# Patient Record
Sex: Female | Born: 1970 | Race: White | Hispanic: No | Smoking: Never smoker
Health system: Southern US, Community
[De-identification: ages and names within clinical notes are randomized; demographics above are authoritative.]

## PROBLEM LIST (undated history)

## (undated) DIAGNOSIS — R51 Headache: Secondary | ICD-10-CM

## (undated) DIAGNOSIS — F329 Major depressive disorder, single episode, unspecified: Secondary | ICD-10-CM

## (undated) DIAGNOSIS — F32A Depression, unspecified: Secondary | ICD-10-CM

## (undated) DIAGNOSIS — R200 Anesthesia of skin: Secondary | ICD-10-CM

## (undated) HISTORY — DX: Depression, unspecified: F32.A

## (undated) HISTORY — DX: Headache: R51

## (undated) HISTORY — PX: THYROGLOSSAL DUCT CYST: SHX297

## (undated) HISTORY — DX: Major depressive disorder, single episode, unspecified: F32.9

## (undated) HISTORY — DX: Anesthesia of skin: R20.0

---

## 2000-11-22 ENCOUNTER — Ambulatory Visit (HOSPITAL_COMMUNITY): Admission: RE | Admit: 2000-11-22 | Discharge: 2000-11-22 | Payer: Self-pay | Admitting: Obstetrics

## 2000-11-22 ENCOUNTER — Encounter: Payer: Self-pay | Admitting: Obstetrics

## 2001-01-07 ENCOUNTER — Inpatient Hospital Stay (HOSPITAL_COMMUNITY): Admission: AD | Admit: 2001-01-07 | Discharge: 2001-01-07 | Payer: Self-pay | Admitting: Obstetrics

## 2001-01-23 ENCOUNTER — Encounter (INDEPENDENT_AMBULATORY_CARE_PROVIDER_SITE_OTHER): Payer: Self-pay

## 2001-01-23 ENCOUNTER — Inpatient Hospital Stay (HOSPITAL_COMMUNITY): Admission: AD | Admit: 2001-01-23 | Discharge: 2001-01-27 | Payer: Self-pay | Admitting: Obstetrics

## 2004-09-15 ENCOUNTER — Encounter: Admission: RE | Admit: 2004-09-15 | Discharge: 2004-09-15 | Payer: Self-pay | Admitting: Internal Medicine

## 2004-10-20 ENCOUNTER — Encounter: Admission: RE | Admit: 2004-10-20 | Discharge: 2004-10-20 | Payer: Self-pay | Admitting: Surgery

## 2004-10-24 ENCOUNTER — Encounter (HOSPITAL_COMMUNITY): Admission: RE | Admit: 2004-10-24 | Discharge: 2005-01-22 | Payer: Self-pay | Admitting: Surgery

## 2004-11-13 ENCOUNTER — Encounter (INDEPENDENT_AMBULATORY_CARE_PROVIDER_SITE_OTHER): Payer: Self-pay | Admitting: *Deleted

## 2004-11-13 ENCOUNTER — Ambulatory Visit (HOSPITAL_COMMUNITY): Admission: RE | Admit: 2004-11-13 | Discharge: 2004-11-14 | Payer: Self-pay | Admitting: Surgery

## 2005-02-26 ENCOUNTER — Emergency Department (HOSPITAL_COMMUNITY): Admission: EM | Admit: 2005-02-26 | Discharge: 2005-02-26 | Payer: Self-pay | Admitting: Emergency Medicine

## 2005-02-28 ENCOUNTER — Emergency Department (HOSPITAL_COMMUNITY): Admission: EM | Admit: 2005-02-28 | Discharge: 2005-02-28 | Payer: Self-pay | Admitting: *Deleted

## 2006-02-05 ENCOUNTER — Ambulatory Visit (HOSPITAL_COMMUNITY): Admission: RE | Admit: 2006-02-05 | Discharge: 2006-02-05 | Payer: Self-pay | Admitting: Obstetrics

## 2006-02-21 ENCOUNTER — Encounter: Admission: RE | Admit: 2006-02-21 | Discharge: 2006-02-21 | Payer: Self-pay | Admitting: Obstetrics

## 2006-03-21 ENCOUNTER — Emergency Department (HOSPITAL_COMMUNITY): Admission: EM | Admit: 2006-03-21 | Discharge: 2006-03-21 | Payer: Self-pay | Admitting: Emergency Medicine

## 2006-08-22 ENCOUNTER — Encounter: Admission: RE | Admit: 2006-08-22 | Discharge: 2006-08-22 | Payer: Self-pay | Admitting: Obstetrics

## 2007-08-14 IMAGING — US US MISC SOFT TISSUE
1 series · 13 of 13 positions shown · non-contrast
Comparison: none

CLINICAL DATA: Nodule on clinical exam.
 ULTRASOUND SOFT TISSUE SUPERIOR TO THE THYROID:
 Superior to the thyroid gland, there is a complex cystic structure present.  This complex cystic area measures 2.4 x 1.3 x 2.0 cm.  This does appear to be within the midline and could represent thyroglossal duct cyst.  No adenopathy is seen.  The portion of the thyroid gland that is visible appears normal.

[Series 1: unknown · 0.09mm/px · 13 of 13 slices shown]
[im 1/13]
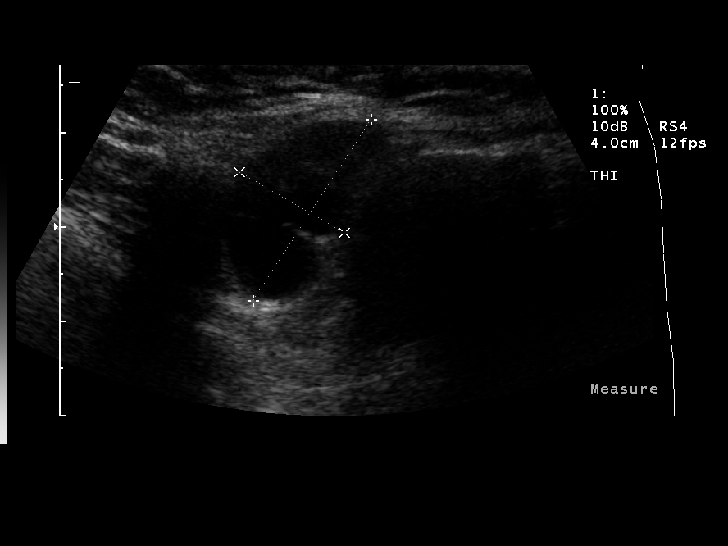
[im 2/13]
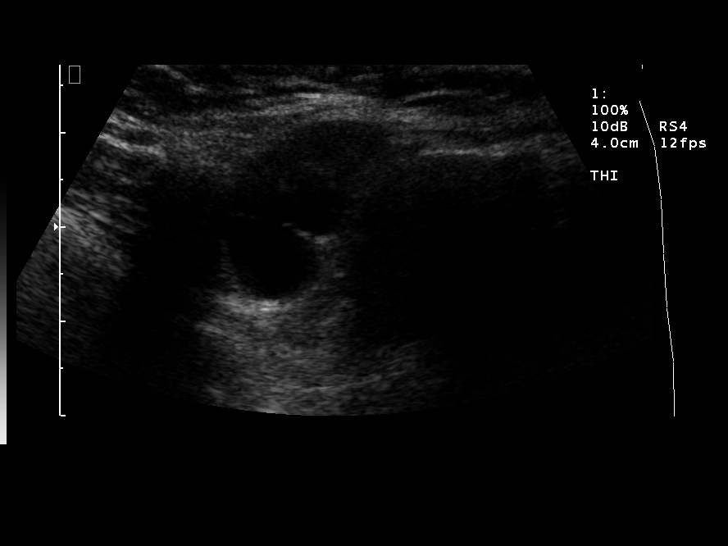
[im 3/13]
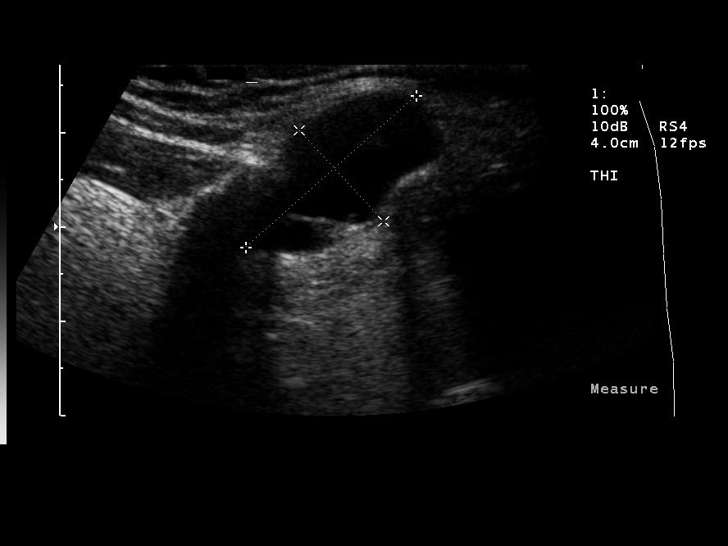
[im 4/13]
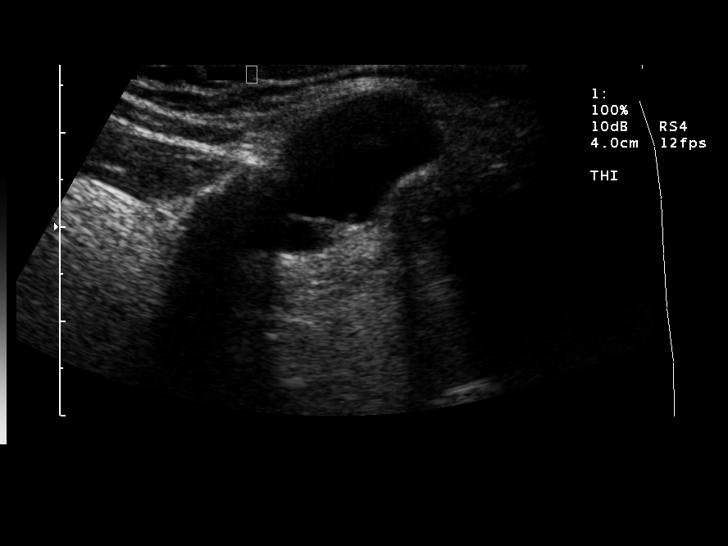
[im 5/13]
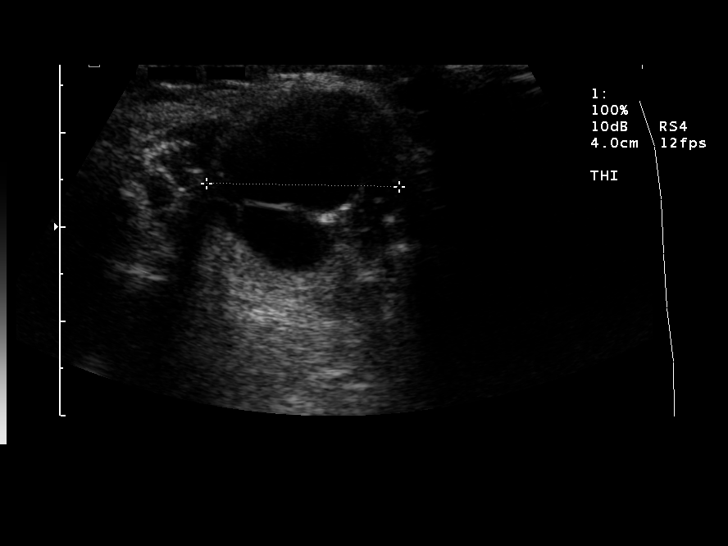
[im 6/13]
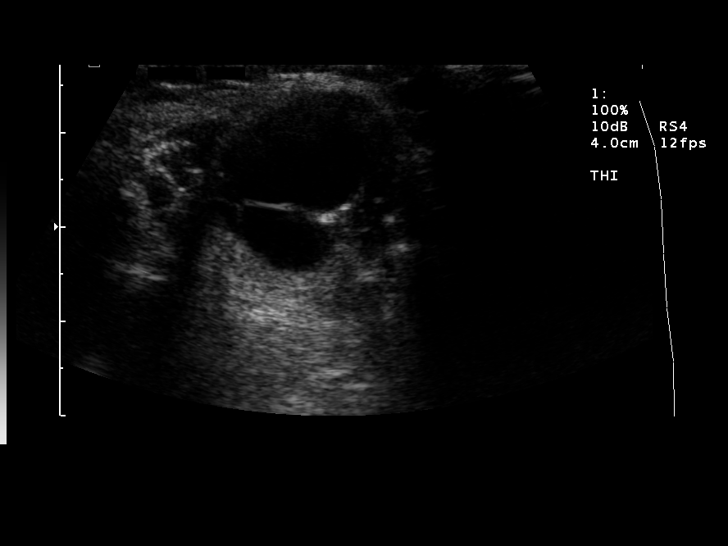
[im 7/13]
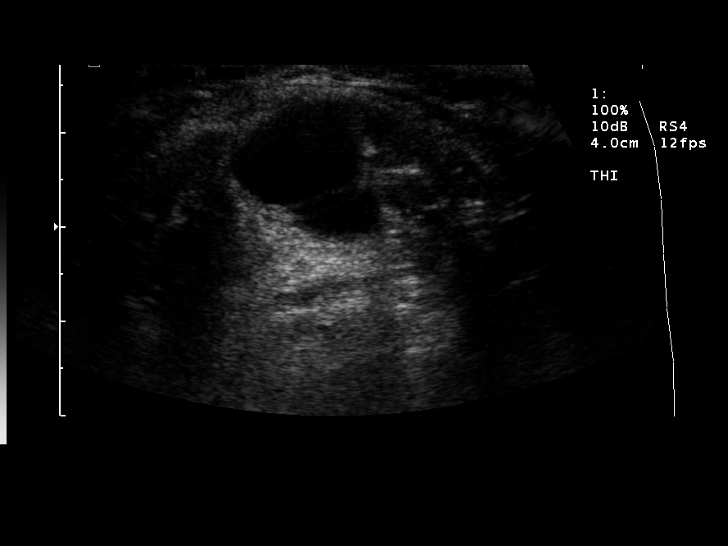
[im 8/13]
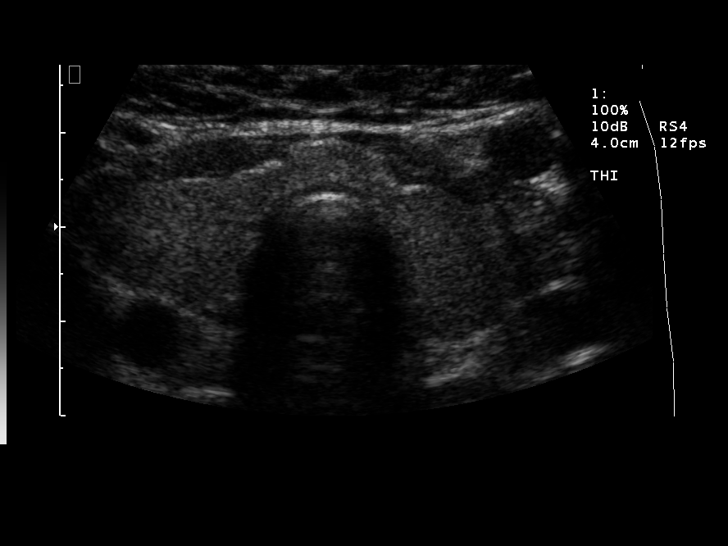
[im 9/13]
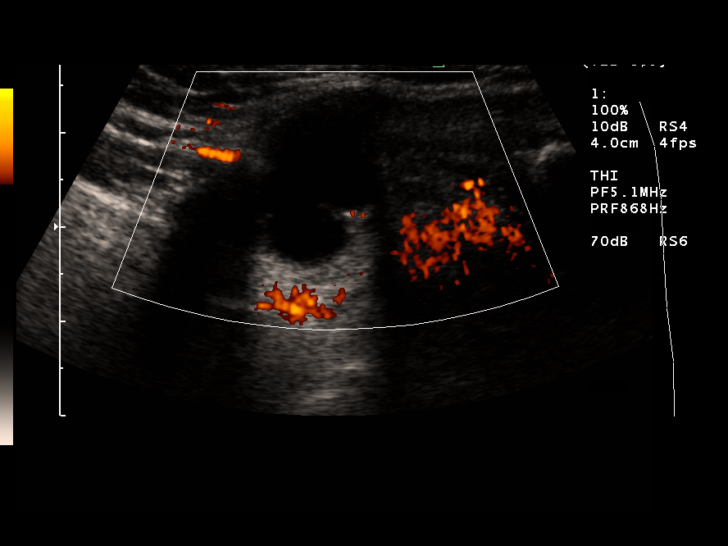
[im 10/13]
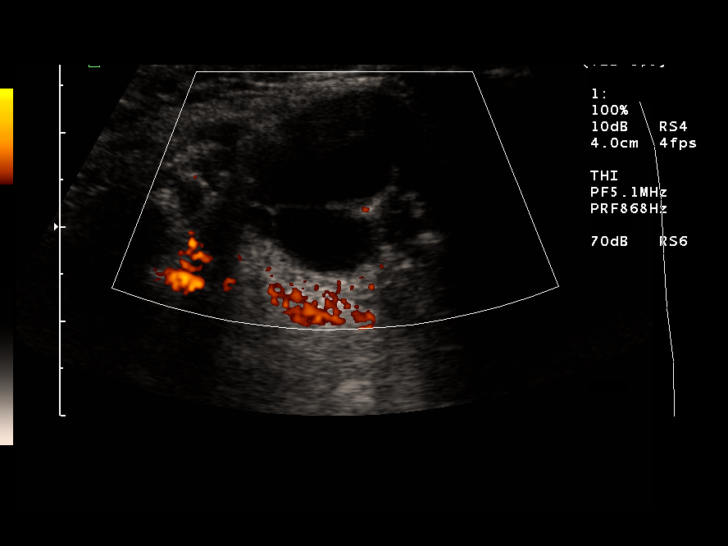
[im 11/13]
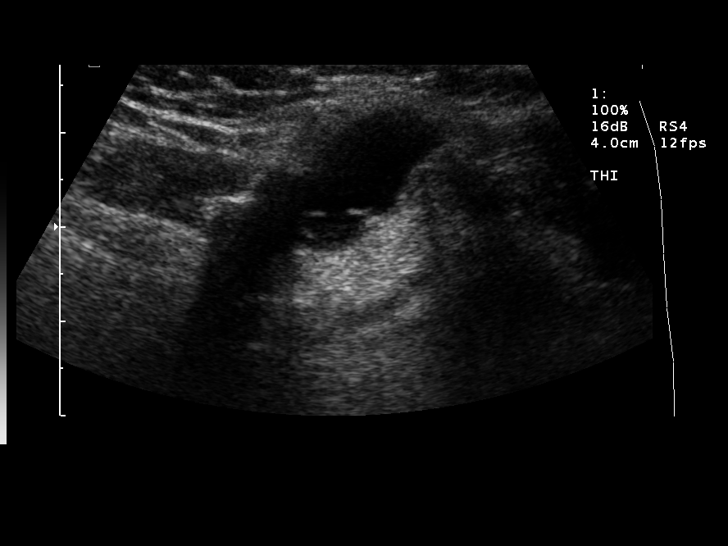
[im 12/13]
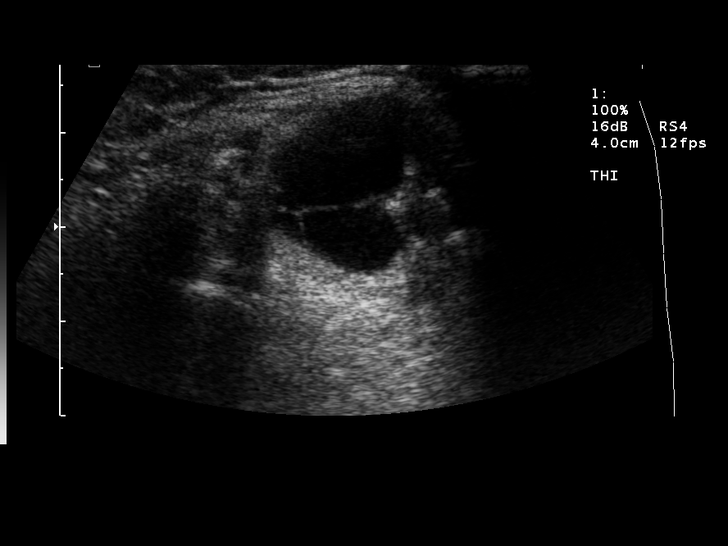
[im 13/13]
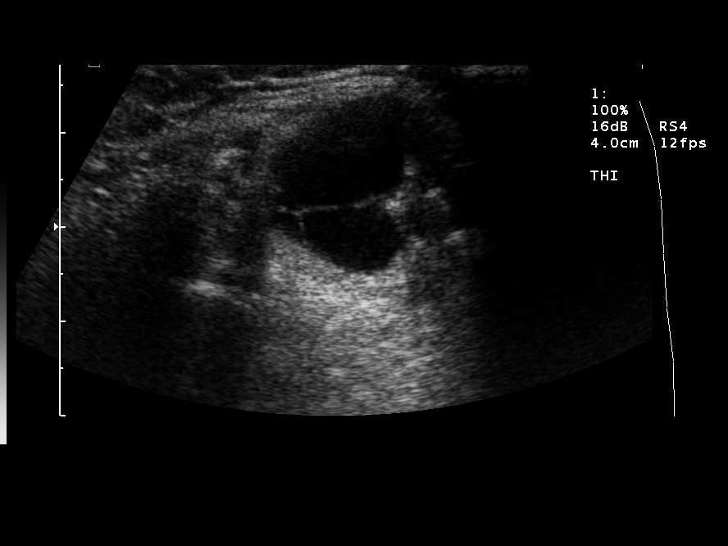

[13 of 13 positions shown; findings below may reference images not displayed]

IMPRESSION: Complex cystic structure in the midline above the thyroid.  Question thyroglossal duct cyst.

## 2007-11-17 ENCOUNTER — Ambulatory Visit: Payer: Self-pay | Admitting: Internal Medicine

## 2007-11-17 DIAGNOSIS — L988 Other specified disorders of the skin and subcutaneous tissue: Secondary | ICD-10-CM | POA: Insufficient documentation

## 2007-11-17 DIAGNOSIS — R519 Headache, unspecified: Secondary | ICD-10-CM | POA: Insufficient documentation

## 2007-11-17 DIAGNOSIS — R51 Headache: Secondary | ICD-10-CM

## 2007-11-17 DIAGNOSIS — F329 Major depressive disorder, single episode, unspecified: Secondary | ICD-10-CM

## 2007-11-17 DIAGNOSIS — E669 Obesity, unspecified: Secondary | ICD-10-CM | POA: Insufficient documentation

## 2008-01-14 ENCOUNTER — Ambulatory Visit: Payer: Self-pay | Admitting: Internal Medicine

## 2008-01-14 DIAGNOSIS — J020 Streptococcal pharyngitis: Secondary | ICD-10-CM | POA: Insufficient documentation

## 2008-01-14 LAB — CONVERTED CEMR LAB: Rapid Strep: POSITIVE

## 2008-05-20 ENCOUNTER — Ambulatory Visit: Payer: Self-pay | Admitting: Family Medicine

## 2008-05-20 DIAGNOSIS — R030 Elevated blood-pressure reading, without diagnosis of hypertension: Secondary | ICD-10-CM

## 2008-05-20 DIAGNOSIS — R1011 Right upper quadrant pain: Secondary | ICD-10-CM | POA: Insufficient documentation

## 2008-05-21 ENCOUNTER — Encounter: Admission: RE | Admit: 2008-05-21 | Discharge: 2008-05-21 | Payer: Self-pay | Admitting: Family Medicine

## 2008-05-21 LAB — CONVERTED CEMR LAB
AST: 21 units/L (ref 0–37)
Albumin: 3.6 g/dL (ref 3.5–5.2)
Alkaline Phosphatase: 95 units/L (ref 39–117)
Basophils Absolute: 0 10*3/uL (ref 0.0–0.1)
Creatinine, Ser: 0.8 mg/dL (ref 0.4–1.2)
Eosinophils Absolute: 0.1 10*3/uL (ref 0.0–0.7)
Glucose, Bld: 77 mg/dL (ref 70–99)
HCT: 36.7 % (ref 36.0–46.0)
Hemoglobin: 12.9 g/dL (ref 12.0–15.0)
Lymphs Abs: 2.5 10*3/uL (ref 0.7–4.0)
MCV: 86.7 fL (ref 78.0–100.0)
Monocytes Absolute: 0.8 10*3/uL (ref 0.1–1.0)
Platelets: 347 10*3/uL (ref 150.0–400.0)
RBC: 4.23 M/uL (ref 3.87–5.11)
Total Bilirubin: 0.4 mg/dL (ref 0.3–1.2)

## 2008-05-26 ENCOUNTER — Encounter: Payer: Self-pay | Admitting: Family Medicine

## 2009-01-31 ENCOUNTER — Ambulatory Visit: Payer: Self-pay | Admitting: Internal Medicine

## 2009-01-31 DIAGNOSIS — J069 Acute upper respiratory infection, unspecified: Secondary | ICD-10-CM | POA: Insufficient documentation

## 2010-01-31 NOTE — Assessment & Plan Note (Signed)
Summary: fever/vomitting/diarrhea/cough/per Judy/cjr   Vital Signs:  Patient profile:   40 year old female Weight:      284 pounds BMI:     44.64 Temp:     98.6 degrees F oral BP sitting:   124 / 102  (left arm) Cuff size:   large  Vitals Entered By: Raechel Ache, RN (January 31, 2009 2:54 PM) CC: Sick since Friday night- Fever 102, sore throat, cough, vomiting, diarrhea and now congestion.   CC:  Sick since Friday night- Fever 102, sore throat, cough, vomiting, and diarrhea and now congestion.Marland Kitchen  History of Present Illness: 40 year old patient who presents with a 3-day history of fever, cough episodic, vomiting, and diarrhea.  Her GI symptoms have resolved.  Her main complaints are  largely nonproductive cough, congestion, mild sore throat.  Her fever also has normalized.  Blood pressure on arrival elevated  Allergies: No Known Drug Allergies  Past History:  Past Medical History: Reviewed history from 11/17/2007 and no changes required. Depression Headache  Review of Systems       The patient complains of anorexia, fever, and prolonged cough.  The patient denies weight loss, weight gain, vision loss, decreased hearing, hoarseness, chest pain, syncope, dyspnea on exertion, peripheral edema, headaches, hemoptysis, abdominal pain, melena, hematochezia, severe indigestion/heartburn, hematuria, incontinence, genital sores, muscle weakness, suspicious skin lesions, transient blindness, difficulty walking, depression, unusual weight change, abnormal bleeding, enlarged lymph nodes, angioedema, and breast masses.    Physical Exam  General:  overweight-appearing.  repeat blood pressure normal at 120/80 Head:  Normocephalic and atraumatic without obvious abnormalities. No apparent alopecia or balding. Eyes:  No corneal or conjunctival inflammation noted. EOMI. Perrla. Funduscopic exam benign, without hemorrhages, exudates or papilledema. Vision grossly normal. Ears:  External ear exam  shows no significant lesions or deformities.  Otoscopic examination reveals clear canals, tympanic membranes are intact bilaterally without bulging, retraction, inflammation or discharge. Hearing is grossly normal bilaterally. Mouth:  pharyngeal erythema.  tonsillar enlargementpharyngeal erythema.   Neck:  No deformities, masses, or tenderness noted; no adenopathy Chest Wall:  No deformities, masses, or tenderness noted. Lungs:  Normal respiratory effort, chest expands symmetrically. Lungs are clear to auscultation, no crackles or wheezes.  normal O2 sat Heart:  Normal rate and regular rhythm. S1 and S2 normal without gallop, murmur, click, rub or other extra sounds.   Impression & Recommendations:  Problem # 1:  URI (ICD-465.9)  Her updated medication list for this problem includes:    Hydrocodone-homatropine 5-1.5 Mg/12ml Syrp (Hydrocodone-homatropine) .Marland Kitchen... 1 teaspoon every 6 hours as needed for cough  Her updated medication list for this problem includes:    Hydrocodone-homatropine 5-1.5 Mg/42ml Syrp (Hydrocodone-homatropine) .Marland Kitchen... 1 teaspoon every 6 hours as needed for cough  Problem # 2:  ELEVATED BLOOD PRESSURE (ICD-796.2)  Complete Medication List: 1)  Hydrocodone-homatropine 5-1.5 Mg/60ml Syrp (Hydrocodone-homatropine) .Marland Kitchen.. 1 teaspoon every 6 hours as needed for cough  Patient Instructions: 1)  Get plenty of rest, drink lots of clear liquids, and use Tylenol or Ibuprofen for fever and comfort. Return in 7-10 days if you're not better:sooner if you're feeling worse. Prescriptions: HYDROCODONE-HOMATROPINE 5-1.5 MG/5ML SYRP (HYDROCODONE-HOMATROPINE) 1 teaspoon every 6 hours as needed for cough  #6oz x 2   Entered and Authorized by:   Gordy Savers  MD   Signed by:   Gordy Savers  MD on 01/31/2009   Method used:   Print then Give to Patient   RxID:   [id number]

## 2010-05-19 NOTE — Discharge Summary (Signed)
[facility]  Patient:    Rita Hill, Rita Hill Visit Number: [id number] MRN: [id number]          Service Type: OBS Location: 910A 9101 01 Attending Physician:  Venita Sheffield Dictated by:   Kathreen Cosier, M.D. Admit Date:  01/23/2001 Discharge Date: 01/27/2001                             Discharge Summary  HISTORY OF PRESENT ILLNESS:   The patient is a 40 year old, primigravida with an EDC of January 24, 2001.  Positive GBS treated with ampicillin at 36 weeks. She was admitted because of a blood pressure of 150/90-150/100 and 3+ proteinuria.  The cervix was fingertip and vertex -2.  HOSPITAL COURSE:              She received Cytotec x 2, magnesium sulfate, and Pitocin stimulation and underwent primary low transverse cesarean section because of failure to progress in labor.  She had a 7 pound 12 ounce female with Apgars of 7 and 9 from the OP position.  On admission, her white count was 8.7.  It went to 11.9.  Platelets ranged between 364 and 311.  Her uric acid ranged between 5.7 and 5.9.  She did well postoperatively.  DISPOSITION:                  She was discharged on the third postoperative day, ambulatory and on a regular diet.  DISCHARGE MEDICATIONS:        Tylox one p.o. q.4h.  FOLLOW-UP:                    To see me in six weeks.  DISCHARGE DIAGNOSIS:          Status post primary low transverse cesarean section at term because of failure to progress. Dictated by:   Kathreen Cosier, M.D. Attending Physician:  Venita Sheffield DD:  01/29/01 TD:  01/29/01 Job: [id number]

## 2010-05-19 NOTE — Op Note (Signed)
[facility]  Patient:    Rita Hill, Rita Hill Visit Number: [id number] MRN: [id number]          Service Type: OBS Location: 910A 9101 01 Attending Physician:  Venita Sheffield Dictated by:   Kathreen Cosier, M.D. Proc. Date: 01/24/01 Admit Date:  01/23/2001 Discharge Date: 01/27/2001                             Operative Report  PREOPERATIVE DIAGNOSIS:       Failure to progress in labor.  ANESTHESIA:                   Epidural.  DESCRIPTION OF PROCEDURE:     The patient was placed on the operating table in the supine position after being prepped and draped and bladder emptied with Foley catheter.  A transverse suprapubic incision was made and carried down through the rectus fascia.  The fascia was ______ the length of the incision, and the recti muscles were retracted laterally.  The peritoneum was incised longitudinally. A transverse incision was made in the viscera in the peritoneum above the bladder, and the bladder was immobilized inferiorly. A transverse lower uterine incision was made.  The patient was delivered from the OP position.  It was a female with Apgars of 7 and 9, weighing 7 lb, 12 oz. The placenta was posterior, removed, and sent to pathology. The uterine cavity was cleaned with dry laps.  The uterine incision was closed in one layer with continuous suture of #1 chromic.  The bladder flap was reattached with 2-0 chromic.  The uterus was well-contracted.  The tubes and ovaries were normal.  The abdomen was closed in layers, the peritoneum with continuous suture of 0 chromic, the fascia with continuous suture of 0 Dexon, and the skin closed with subcuticular stitch of 3-0 plain. Blood loss 600 cc.  The patient tolerated the procedure well and was taken to the recovery room in good condition. Dictated by:   Kathreen Cosier, M.D. Attending Physician:  Venita Sheffield DD:  02/13/01 TD:  02/13/01 Job:  [id number]

## 2010-05-19 NOTE — Op Note (Signed)
Rita Hill, Rita Hill              ACCOUNT NO.:  [id number]   MEDICAL RECORD NO.:  [id number]          PATIENT TYPE:  AMB   LOCATION:  DAY                          FACILITY:  [facility]   PHYSICIAN:  Currie Paris, M.D.DATE OF BIRTH:  [birth year]   DATE OF PROCEDURE:  11/13/2004  DATE OF DISCHARGE:                                 OPERATIVE REPORT   CCS NUMBER:  [id number].   PREOPERATIVE DIAGNOSIS:  Thyroglossal duct cyst.   POSTOPERATIVE DIAGNOSIS:  Thyroglossal duct cyst.   OPERATION:  Excision of thyroglossal duct cyst.   SURGEON:  Currie Paris, M.D.   ANESTHESIA:  General.   CLINICAL HISTORY:  This is a 40 year old lady who has presented with a small  mass in the anterior cervical area just superior to the thyroid and appeared  to be a complex cyst.  After discussion with the patient, she elected to  have this mass excised with a clinical diagnosis of thyroglossal duct cyst.   DESCRIPTION OF PROCEDURE:  The patient was in the holding area, and she had  no further questions.  The mass was palpable and marked.  It was just very  subtle in this patient, who has a fairly large neck.  She was taken to the  operating room.  After satisfactory general endotracheal anesthesia  obtained, the neck was prepped and draped.  A time out occurred.   I made a transverse incision directly over the area we had marked in the  holding area.  Subcutaneous tissues were divided, and subcutaneous flaps  were raised superiorly and inferiorly.  Initially, I did not see the mass,  but I did find it in the midline just above the larynx.  It was fairly well  circumscribed.   Using careful dissection, I was able to free it up with a combination of  blunt and sharp dissection from the underlying and surrounding tissues  inferiorly and to the right and left and then underneath it and deep to it.  It tracked superiorly and towards the hyoid.  Clearly, I thought it  represented a thyroglossal duct  cyst.  Once I had it freed up, except for  around the hyoid bone, I freed the hyoid by using the cautery to detach  muscular attachments from the central portion of it, and going about 0.5 cm  on either side of where I thought the cyst was going into and attaching and  probably going through the hyoid bone, central portion.  Once this was  completely freed up, I was able to divide the hyoid.  There was a small  strand of tissue going directly deep from here, and I traced it to what I  thought was the base of the tongue, consistent with the residual cyst/sinus.  This was clamped at its base and suture-ligated with a 3-0 Vicryl.  The  specimen washanded off and sent to pathhology.   I made sure everything was dry.  I put some Marcaine in it to help with  postoperative analgesia.  I left a little Surgicel around the area where the  hyoid had been divided,  but it did appear to be dry.   The incision was then closed with some 3-0 Vicryl, 4-0 Monocryl  subcuticular, and Dermabond.  The patient tolerated the procedure well.  There were no complications.  All counts were correct.      Currie Paris, M.D.  Electronically Signed     CJS/MEDQ  D:  11/13/2004  T:  11/13/2004  Job:  [id number]

## 2010-08-10 ENCOUNTER — Other Ambulatory Visit (HOSPITAL_COMMUNITY): Payer: Self-pay | Admitting: Obstetrics

## 2010-08-10 ENCOUNTER — Other Ambulatory Visit: Payer: Self-pay | Admitting: Obstetrics

## 2010-08-10 DIAGNOSIS — E282 Polycystic ovarian syndrome: Secondary | ICD-10-CM

## 2010-08-10 DIAGNOSIS — Z1231 Encounter for screening mammogram for malignant neoplasm of breast: Secondary | ICD-10-CM

## 2010-08-17 ENCOUNTER — Ambulatory Visit
Admission: RE | Admit: 2010-08-17 | Discharge: 2010-08-17 | Disposition: A | Discharge status: Home / Self Care | Payer: 59 | Source: Ambulatory Visit | Attending: Obstetrics | Admitting: Obstetrics

## 2010-08-17 ENCOUNTER — Ambulatory Visit (HOSPITAL_COMMUNITY)
Admission: RE | Admit: 2010-08-17 | Discharge: 2010-08-17 | Disposition: A | Discharge status: Home / Self Care | Payer: 59 | Source: Ambulatory Visit | Attending: Obstetrics | Admitting: Obstetrics

## 2010-08-17 DIAGNOSIS — E282 Polycystic ovarian syndrome: Secondary | ICD-10-CM

## 2010-08-17 DIAGNOSIS — Z1231 Encounter for screening mammogram for malignant neoplasm of breast: Secondary | ICD-10-CM

## 2010-08-17 DIAGNOSIS — N912 Amenorrhea, unspecified: Secondary | ICD-10-CM | POA: Insufficient documentation

## 2010-08-17 DIAGNOSIS — N949 Unspecified condition associated with female genital organs and menstrual cycle: Secondary | ICD-10-CM | POA: Insufficient documentation

## 2011-02-21 ENCOUNTER — Encounter: Payer: Self-pay | Admitting: Family

## 2011-02-21 ENCOUNTER — Ambulatory Visit (INDEPENDENT_AMBULATORY_CARE_PROVIDER_SITE_OTHER): Payer: 59 | Admitting: Family

## 2011-02-21 VITALS — BP 130/90 | Temp 98.0°F | Ht 67.0 in | Wt 305.0 lb

## 2011-02-21 DIAGNOSIS — J069 Acute upper respiratory infection, unspecified: Secondary | ICD-10-CM

## 2011-02-21 DIAGNOSIS — R05 Cough: Secondary | ICD-10-CM

## 2011-02-21 MED ORDER — FEXOFENADINE-PSEUDOEPHED ER 180-240 MG PO TB24: 1.0000 | ORAL_TABLET | Freq: Every day | ORAL | Status: AC

## 2011-02-21 MED ORDER — PREDNISONE 20 MG PO TABS: 40.0000 mg | ORAL_TABLET | Freq: Every day | ORAL | Status: AC

## 2011-02-21 NOTE — Patient Instructions (Signed)

## 2011-02-21 NOTE — Progress Notes (Signed)
  Subjective:    Patient ID: Rita Hill, female    DOB: [birth year], 41 y.o.   MRN: [id number]  Sinusitis This is a new problem. The current episode started yesterday. The problem is unchanged. There has been no fever. Associated symptoms include congestion, coughing, ear pain and sneezing. Past treatments include oral decongestants. The treatment provided mild relief.      Review of Systems  Constitutional: Negative.   HENT: Positive for ear pain, congestion and sneezing.   Eyes: Negative.   Respiratory: Positive for cough.   Cardiovascular: Negative.   Genitourinary: Negative.   Musculoskeletal: Negative.   Skin: Negative.   Neurological: Negative.   Hematological: Negative.    Past Medical History  Diagnosis Date  . Depression   . Headache     History   Social History  . Marital Status: Married    Spouse Name: N/A    Number of Children: N/A  . Years of Education: N/A   Occupational History  . Not on file.   Social History Main Topics  . Smoking status: Never Smoker   . Smokeless tobacco: Not on file  . Alcohol Use:   . Drug Use:   . Sexually Active:    Other Topics Concern  . Not on file   Social History Narrative  . No narrative on file    Past Surgical History  Procedure Date  . Cesarean section   . Thyroglossal duct cyst     removal    Family History  Problem Relation Age of Onset  . Hypertension Mother   . Hyperlipidemia Mother     No Known Allergies  No current outpatient prescriptions on file prior to visit.    BP 130/90  Temp(Src) 98 F (36.7 C) (Oral)  Ht 5\' 7"  (1.702 m)  Wt 305 lb (138.347 kg)  BMI 47.77 kg/m2chart    Objective:   Physical Exam  Constitutional: She appears well-developed and well-nourished.  HENT:  Right Ear: External ear normal.  Nose: Nose normal.  Mouth/Throat: Oropharynx is clear and moist.       Pharynx red  Neck: Normal range of motion. Neck supple.  Cardiovascular: Normal rate, regular rhythm  and normal heart sounds.   Pulmonary/Chest: Effort normal and breath sounds normal.  Musculoskeletal: Normal range of motion.  Neurological: She is alert.  Skin: Skin is warm and dry.  Psychiatric: She has a normal mood and affect.          Assessment & Plan:  Assessment: Upper Respiratory Infection, Cough  Plan: Prednisone 20mg  2 po QD x 5 days. Allegra-d 1 tab po QD. Call the office if symptoms worsen or persist. Recheck as scheduled and prn.

## 2011-09-04 ENCOUNTER — Ambulatory Visit (INDEPENDENT_AMBULATORY_CARE_PROVIDER_SITE_OTHER): Payer: 59 | Admitting: Internal Medicine

## 2011-09-04 ENCOUNTER — Encounter: Payer: Self-pay | Admitting: Internal Medicine

## 2011-09-04 VITALS — BP 140/80 | Temp 98.2°F | Wt 312.0 lb

## 2011-09-04 DIAGNOSIS — E669 Obesity, unspecified: Secondary | ICD-10-CM

## 2011-09-04 DIAGNOSIS — R030 Elevated blood-pressure reading, without diagnosis of hypertension: Secondary | ICD-10-CM

## 2011-09-04 MED ORDER — HYDROCODONE-HOMATROPINE 5-1.5 MG/5ML PO SYRP: 5.0000 mL | ORAL_SOLUTION | Freq: Three times a day (TID) | ORAL | Status: AC | PRN

## 2011-09-04 NOTE — Patient Instructions (Addendum)
Get plenty of rest, Drink lots of  clear liquids, and use Tylenol or ibuprofen for fever and discomfort.    Nasonex-  use daily  Limit your sodium (Salt) intake  Please check your blood pressure on a regular basis.  If it is consistently greater than 150/90, please make an office appointment.  Obtain a home blood pressure monitor and return in 6 weeks to reassess your blood pressure    It is important that you exercise regularly, at least 20 minutes 3 to 4 times per week.  If you develop chest pain or shortness of breath seek  medical attention.  You need to lose weight.  Consider a lower calorie diet and regular exercise.DASH Diet The DASH diet stands for "Dietary Approaches to Stop Hypertension." It is a healthy eating plan that has been shown to reduce high blood pressure (hypertension) in as little as 14 days, while also possibly providing other significant health benefits. These other health benefits include reducing the risk of breast cancer after menopause and reducing the risk of type 2 diabetes, heart disease, colon cancer, and stroke. Health benefits also include weight loss and slowing kidney failure in patients with chronic kidney disease.   DIET GUIDELINES  Limit salt (sodium). Your diet should contain less than 1500 mg of sodium daily.   Limit refined or processed carbohydrates. Your diet should include mostly whole grains. Desserts and added sugars should be used sparingly.   Include small amounts of heart-healthy fats. These types of fats include nuts, oils, and tub margarine. Limit saturated and trans fats. These fats have been shown to be harmful in the body.  CHOOSING FOODS   The following food groups are based on a 2000 calorie diet. See your Registered Dietitian for individual calorie needs. Grains and Grain Products (6 to 8 servings daily)  Eat More Often: Whole-wheat bread, brown rice, whole-grain or wheat pasta, quinoa, popcorn without added fat or salt (air popped).     Eat Less Often: White bread, white pasta, white rice, cornbread.  Vegetables (4 to 5 servings daily)  Eat More Often: Fresh, frozen, and canned vegetables. Vegetables may be raw, steamed, roasted, or grilled with a minimal amount of fat.   Eat Less Often/Avoid: Creamed or fried vegetables. Vegetables in a cheese sauce.  Fruit (4 to 5 servings daily)  Eat More Often: All fresh, canned (in natural juice), or frozen fruits. Dried fruits without added sugar. One hundred percent fruit juice ( cup [237 mL] daily).   Eat Less Often: Dried fruits with added sugar. Canned fruit in light or heavy syrup.  [employer], Fish, and Poultry (2 servings or less daily. One serving is 3 to 4 oz [85-114 g]).  Eat More Often: Ninety percent or leaner ground beef, tenderloin, sirloin. Round cuts of beef, chicken breast, [place] breast. All fish. Grill, bake, or broil your meat. Nothing should be fried.   Eat Less Often/Avoid: Fatty cuts of meat, [place], or chicken leg, thigh, or wing. Fried cuts of meat or fish.  Dairy (2 to 3 servings)  Eat More Often: Low-fat or fat-free milk, low-fat plain or light yogurt, reduced-fat or part-skim cheese.   Eat Less Often/Avoid: Milk (whole, 2%, skim, or chocolate). Whole milk yogurt. Full-fat cheeses.  Nuts, Seeds, and Legumes (4 to 5 servings per week)  Eat More Often: All without added salt.   Eat Less Often/Avoid: Salted nuts and seeds, canned beans with added salt.  Fats and Sweets (limited)  Eat More Often: Vegetable  oils, tub margarines without trans fats, sugar-free gelatin. Mayonnaise and salad dressings.   Eat Less Often/Avoid: Coconut oils, palm oils, butter, stick margarine, cream, half and half, cookies, candy, pie.  FOR MORE INFORMATION The Dash Diet Eating Plan: www.dashdiet.org Document Released: 12/07/2010 Document Reviewed: 11/27/2010 [facility] Patient Information 2012 [place], [place].

## 2011-09-04 NOTE — Progress Notes (Signed)
  Subjective:    Patient ID: Rita Hill, female    DOB: 1970-10-30, 41 y.o.   MRN: [id number]  HPI  41 year old patient who is seen today for followup. For the past 4 weeks she has noticed some fullness involving the left ear only. For one day she has had some head and chest congestion and cough. No fever. 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 She also has a history of some elevated blood pressure readings in the past. A recent dental cleaning was rescheduled it to elevated blood pressure at her dentist.  BP Readings from Last 3 Encounters:  09/04/11 140/80  02/21/11 130/90  01/31/09 124/102      Review of Systems  Constitutional: Positive for fatigue.  HENT: Positive for congestion and rhinorrhea. Negative for hearing loss, sore throat, dental problem, sinus pressure and tinnitus.   Eyes: Negative for pain, discharge and visual disturbance.  Respiratory: Positive for cough and chest tightness. Negative for shortness of breath.   Cardiovascular: Negative for chest pain, palpitations and leg swelling.  Gastrointestinal: Negative for nausea, vomiting, abdominal pain, diarrhea, constipation, blood in stool and abdominal distention.  Genitourinary: Negative for dysuria, urgency, frequency, hematuria, flank pain, vaginal bleeding, vaginal discharge, difficulty urinating, vaginal pain and pelvic pain.  Musculoskeletal: Negative for joint swelling, arthralgias and gait problem.  Skin: Negative for rash.  Neurological: Negative for dizziness, syncope, speech difficulty, weakness, numbness and headaches.  Hematological: Negative for adenopathy.  Psychiatric/Behavioral: Negative for behavioral problems, dysphoric mood and agitation. The patient is not nervous/anxious.        Objective:   Physical Exam  Constitutional: She is oriented to person, place, and time. She appears well-developed and well-nourished.       Blood pressure 140/96  HENT:  Head: Normocephalic.  Right Ear:  External ear normal.  Left Ear: External ear normal.  Mouth/Throat: Oropharynx is clear and moist.  Eyes: Conjunctivae and EOM are normal. Pupils are equal, round, and reactive to light.  Neck: Normal range of motion. Neck supple. No thyromegaly present.  Cardiovascular: Normal rate, regular rhythm, normal heart sounds and intact distal pulses.   Pulmonary/Chest: Effort normal and breath sounds normal.  Abdominal: Soft. Bowel sounds are normal. She exhibits no mass. There is no tenderness.  Musculoskeletal: Normal range of motion.  Lymphadenopathy:    She has no cervical adenopathy.  Neurological: She is alert and oriented to person, place, and time.  Skin: Skin is warm and dry. No rash noted.  Psychiatric: She has a normal mood and affect. Her behavior is normal.          Assessment & Plan:   Viral  URI. We'll treat symptomatically  rule out hypertension. Nonpharmacologic measures discussed. Home blood pressure monitor and discussed. Last return in 6 weeks for followup Exogenous obesity. Diet weight loss encouraged

## 2011-10-16 ENCOUNTER — Ambulatory Visit: Payer: 59 | Admitting: Internal Medicine

## 2012-07-07 ENCOUNTER — Encounter: Payer: Self-pay | Admitting: Internal Medicine

## 2012-07-07 ENCOUNTER — Ambulatory Visit (INDEPENDENT_AMBULATORY_CARE_PROVIDER_SITE_OTHER): Payer: 59 | Admitting: Internal Medicine

## 2012-07-07 VITALS — BP 142/96 | HR 84 | Temp 98.5°F | Resp 20 | Wt 294.0 lb

## 2012-07-07 DIAGNOSIS — R29810 Facial weakness: Secondary | ICD-10-CM

## 2012-07-07 MED ORDER — VALACYCLOVIR HCL 1 G PO TABS: ORAL_TABLET | ORAL | Status: AC

## 2012-07-07 MED ORDER — HYDROCHLOROTHIAZIDE 25 MG PO TABS: 25.0000 mg | ORAL_TABLET | Freq: Every day | ORAL | Status: AC

## 2012-07-07 MED ORDER — PREDNISONE 20 MG PO TABS: ORAL_TABLET | ORAL | Status: AC

## 2012-07-07 NOTE — Progress Notes (Signed)
  Subjective:    Patient ID: Rita Hill, female    DOB: Mar 15, 1970, 42 y.o.   MRN: [id number]  HPI 42 year old patient who was stable until Saturday when she first noticed some numbness involving the left lateral tongue. Yesterday she noted some numbness involving the left lip and left buccal mucosa. She has a chronic mild left ptosis but this has worsened and she has also developed a mild left facial weakness. The past few weeks she has noticed some ringing in the ears that has intensified over the past week for several weeks she also describes some nonspecific difficulties with her balance.  Past Medical History  Diagnosis Date  . Depression   . Headache(784.0)     History   Social History  . Marital Status: Married    Spouse Name: N/A    Number of Children: N/A  . Years of Education: N/A   Occupational History  . Not on file.   Social History Main Topics  . Smoking status: Never Smoker   . Smokeless tobacco: Never Used  . Alcohol Use: No  . Drug Use: No  . Sexually Active: Not on file   Other Topics Concern  . Not on file   Social History Narrative  . No narrative on file    Past Surgical History  Procedure Laterality Date  . Cesarean section    . Thyroglossal duct cyst      removal    Family History  Problem Relation Age of Onset  . Hypertension Mother   . Hyperlipidemia Mother     No Known Allergies  No current outpatient prescriptions on file prior to visit.   No current facility-administered medications on file prior to visit.    BP 142/96  Pulse 84  Temp(Src) 98.5 F (36.9 C) (Oral)  Resp 20  Wt 294 lb (133.358 kg)  BMI 46.04 kg/m2  SpO2 98%      Review of Systems  Neurological: Positive for dizziness, weakness and light-headedness.       Objective:   Physical Exam  Constitutional: She appears well-developed and well-nourished. No distress.  Blood pressure 140/100  HENT:  Right Ear: External ear normal.  Left Ear: External  ear normal.  Musculoskeletal:  Mild left facial weakness with ptosis and decrease eye blink on the left Diminished left nasolabial fold Extractor muscles are full No drift Normal finger-nose testing Romberg negative Able to do a tandem walk but a bit unsteady Decreased sensation left facial area          Assessment & Plan:   Atypical Bells palsy  With sensory findings HTN  Will treat with Prednisone and Valtrex Consider MRI

## 2012-07-07 NOTE — Patient Instructions (Addendum)
Limit your sodium (Salt) intake  You need to lose weight.  Consider a lower calorie diet and regular exercise.  Call or return to clinic prn if these symptoms worsen or fail to improve as anticipated.  Recheck 1 week  Brain MRI as discussed

## 2012-07-14 ENCOUNTER — Encounter: Payer: Self-pay | Admitting: Internal Medicine

## 2012-07-14 ENCOUNTER — Other Ambulatory Visit: Payer: Self-pay | Admitting: Internal Medicine

## 2012-07-14 ENCOUNTER — Ambulatory Visit (INDEPENDENT_AMBULATORY_CARE_PROVIDER_SITE_OTHER): Payer: 59 | Admitting: Internal Medicine

## 2012-07-14 VITALS — BP 150/90 | HR 78 | Temp 98.6°F | Resp 20 | Wt 290.0 lb

## 2012-07-14 DIAGNOSIS — G510 Bell's palsy: Secondary | ICD-10-CM

## 2012-07-14 DIAGNOSIS — R29810 Facial weakness: Secondary | ICD-10-CM

## 2012-07-14 NOTE — Patient Instructions (Signed)
followup neurology as discussed  Call or return to clinic prn if these symptoms worsen or fail to improve as anticipated.

## 2012-07-14 NOTE — Progress Notes (Signed)
Subjective:    Patient ID: Rita Hill, female    DOB: 02/05/1970, 42 y.o.   MRN: [id number]  HPI  42 year old patient who is seen today in followup.  She was seen in 7 days ago with what was felt to be Bell's palsy with an  atypical presentation due to prominent sensory findings.  She has completed 7 days of prednisone and Valtrex.  Her clinical status is unchanged. There has been no improvement nor deterioration or new neurological symptoms.  She asked about the possibility of cerebral aneurysm complications due to her family history of a maternal grandmother and maternal aunt with the cerebral aneurysms  Past Medical History  Diagnosis Date  . Depression   . Headache(784.0)     History   Social History  . Marital Status: Married    Spouse Name: N/A    Number of Children: N/A  . Years of Education: N/A   Occupational History  . Not on file.   Social History Main Topics  . Smoking status: Never Smoker   . Smokeless tobacco: Never Used  . Alcohol Use: No  . Drug Use: No  . Sexually Active: Not on file   Other Topics Concern  . Not on file   Social History Narrative  . No narrative on file    Past Surgical History  Procedure Laterality Date  . Cesarean section    . Thyroglossal duct cyst      removal    Family History  Problem Relation Age of Onset  . Hypertension Mother   . Hyperlipidemia Mother     No Known Allergies  Current Outpatient Prescriptions on File Prior to Visit  Medication Sig Dispense Refill  . hydrochlorothiazide (HYDRODIURIL) 25 MG tablet Take 1 tablet (25 mg total) by mouth daily.  90 tablet  3  . ibuprofen (ADVIL,MOTRIN) 200 MG tablet Take 400 mg by mouth as needed for pain.      . predniSONE (DELTASONE) 20 MG tablet 2 tablets once daily  14 tablet  0  . valACYclovir (VALTREX) 1000 MG tablet 1 tablet 3 times daily  21 tablet  0   No current facility-administered medications on file prior to visit.    BP 150/90  Pulse 78   Temp(Src) 98.6 F (37 C) (Oral)  Resp 20  Wt 290 lb (131.543 kg)  BMI 45.41 kg/m2  SpO2 97%       Review of Systems  Neurological: Positive for weakness and numbness.       Objective:   Physical Exam  Constitutional: She is oriented to person, place, and time. She appears well-developed and well-nourished.  HENT:  Head: Normocephalic.  Right Ear: External ear normal.  Left Ear: External ear normal.  Mouth/Throat: Oropharynx is clear and moist.  No vesicles or lesions about the left ear or external canal  Eyes: Conjunctivae and EOM are normal. Pupils are equal, round, and reactive to light.  Neck: Normal range of motion. Neck supple. No thyromegaly present.  Cardiovascular: Normal rate, regular rhythm, normal heart sounds and intact distal pulses.   Pulmonary/Chest: Effort normal and breath sounds normal.  Abdominal: Soft. Bowel sounds are normal. She exhibits no mass. There is no tenderness.  Musculoskeletal: Normal range of motion.  Lymphadenopathy:    She has no cervical adenopathy.  Neurological: She is alert and oriented to person, place, and time. She has normal reflexes. She displays normal reflexes. Coordination normal.  Left peripheral seventh weakness    Impaired eye blink  Left facial numbness involving the  forehead, malar, mandibular and high anterior  neck     No drift Normal finger-nose testing Normal gait including tandem walking  Skin: Skin is warm and dry. No rash noted.  Psychiatric: She has a normal mood and affect. Her behavior is normal.          Assessment & Plan:   Atypical Bell's palsy with sensory abnormalities. The patient has a scheduled neurology appointment for tomorrow. May consider further studies including MRI and/or NCS

## 2012-07-15 ENCOUNTER — Encounter: Payer: Self-pay | Admitting: Neurology

## 2012-07-15 ENCOUNTER — Ambulatory Visit (INDEPENDENT_AMBULATORY_CARE_PROVIDER_SITE_OTHER): Payer: 59 | Admitting: Neurology

## 2012-07-15 VITALS — BP 140/84 | HR 68 | Temp 98.2°F | Ht 67.0 in | Wt 288.0 lb

## 2012-07-15 DIAGNOSIS — R209 Unspecified disturbances of skin sensation: Secondary | ICD-10-CM

## 2012-07-15 DIAGNOSIS — R200 Anesthesia of skin: Secondary | ICD-10-CM

## 2012-07-15 LAB — BASIC METABOLIC PANEL
BUN: 23 mg/dL (ref 6–23)
CO2: 30 mEq/L (ref 19–32)
Calcium: 9.5 mg/dL (ref 8.4–10.5)
Chloride: 101 mEq/L (ref 96–112)
Creatinine, Ser: 0.9 mg/dL (ref 0.4–1.2)
Potassium: 3.1 mEq/L — ABNORMAL LOW (ref 3.5–5.1)

## 2012-07-15 LAB — SEDIMENTATION RATE: Sed Rate: 26 mm/hr — ABNORMAL HIGH (ref 0–22)

## 2012-07-15 NOTE — Patient Instructions (Addendum)
I think the first step is to check MRI of the brain to look at the brain itself, as well as the cranial nerves.  Further testing will be pending these results.  I will contact you once I review the MRI. I will also check for Lyme, ESR and ANA. We will schedule a follow up in one month.  Call with questions and concerns.  We will draw blood work today.  Your MRI is scheduled at [facility] on Monday, July 2st at 10:00 am. Please check in at the first floor radiology department 15 minutes prior to your scheduled appointment time. Enter the hospital off of [address] at [address].     [phone].  Follow up in one month.

## 2012-07-15 NOTE — Progress Notes (Signed)
NEUROLOGY CONSULTATION NOTE  Rita Hill MRN: [id number] DOB: [birth year]    Referring provider: Dr. Amador Cunas Primary care provider: Dr. Amador Cunas  Reason for consult:  Left facial numbness.  HISTORY OF PRESENT ILLNESS: Rita Hill is a 42 y.o. female evaluation of left facial numbness.  EPIC chart reviewed.  Symptoms started about 2 weeks ago. She woke up one morning and noted numbness on the left side of her tongue. She didn't think much of it but when she woke up the following morning, the inside of her left cheek and the left side of her lip was also numb. When she woke up the following morning, the whole left side of her face was numb. She also noted that she was unable to close her left eye. 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 Associated symptoms include tinnitus as well as a fullness in the ears. She denies hyperacusis or hearing loss. She also notes a mild throbbing pain on the back of the left side of her head. She denies any neck pain or recent injury of the neck. She is also had balance issues for a few weeks, in which he experiences vertigo and fears towards the left when she walks. She denies any double vision, loss of vision, blurred vision or other visual disturbance. She has not noticed any reduced sweating on the left side of her face.  She also notes reduced taste on the left side of her tongue.  She was prescribed one week regimen of prednisone and Valtrex.  She has not noticed any improvement or worsening of symptoms. She does continue to take eyedrops and wears a patch over her left eye when she goes to sleep.  She denies any focal numbness or weakness of her body.  Of note, her family history is significant for cerebral aneurysms. Her maternal grandmother passed away from intracranial bleed secondary to an aneurysm. Her maternal aunt had surgery for an unruptured aneurysm. She was wondering if this could be  related to an aneurysm. She does not have any history of polycystic kidney disease or connective tissue disease.  PAST MEDICAL HISTORY: Past Medical History  Diagnosis Date  . Depression   . Headache(784.0)   . Left facial numbness     PAST SURGICAL HISTORY: Past Surgical History  Procedure Laterality Date  . Cesarean section    . Thyroglossal duct cyst      removal    MEDICATIONS: Current Outpatient Prescriptions on File Prior to Visit  Medication Sig Dispense Refill  . hydrochlorothiazide (HYDRODIURIL) 25 MG tablet Take 1 tablet (25 mg total) by mouth daily.  90 tablet  3  . ibuprofen (ADVIL,MOTRIN) 200 MG tablet Take 400 mg by mouth as needed for pain.      . predniSONE (DELTASONE) 20 MG tablet 2 tablets once daily  14 tablet  0  . valACYclovir (VALTREX) 1000 MG tablet 1 tablet 3 times daily  21 tablet  0   No current facility-administered medications on file prior to visit.    ALLERGIES: No Known Allergies  FAMILY HISTORY: Family History  Problem Relation Age of Onset  . Hypertension Mother   . Hyperlipidemia Mother     SOCIAL HISTORY: History   Social History  . Marital Status: Married    Spouse Name: N/A    Number of Children: N/A  . Years of Education: N/A   Occupational History  . Not on file.   Social  History Main Topics  . Smoking status: Never Smoker   . Smokeless tobacco: Never Used  . Alcohol Use: No  . Drug Use: No  . Sexually Active: Not on file   Other Topics Concern  . Not on file   Social History Narrative  . No narrative on file    REVIEW OF SYSTEMS: Constitutional: No fevers, chills, or sweats, no generalized fatigue, change in appetite Eyes: No visual changes, double vision, eye pain Ear, nose and throat: No hearing loss, ear pain, nasal congestion, sore throat Cardiovascular: No chest pain, palpitations Respiratory:  No shortness of breath at rest or with exertion, wheezes GastrointestinaI: No nausea, vomiting, diarrhea,  abdominal pain, fecal incontinence Genitourinary:  No dysuria, urinary retention or frequency Musculoskeletal:  No neck pain, back pain Integumentary: No rash, pruritus, skin lesions Neurological: as above Psychiatric: No depression, insomnia, anxiety Endocrine: No palpitations, fatigue, diaphoresis, mood swings, change in appetite, change in weight, increased thirst Hematologic/Lymphatic:  No anemia, purpura, petechiae. Allergic/Immunologic: no itchy/runny eyes, nasal congestion, recent allergic reactions, rashes  PHYSICAL EXAM: Filed Vitals:   07/15/12 1327  BP: 140/84  Pulse: 68  Temp: 98.2 F (36.8 C)   General: No acute distress Head:  Normocephalic/atraumatic Neck: supple, no paraspinal tenderness, full range of motion Back: No paraspinal tenderness Heart: regular rate and rhythm Lungs: Clear to auscultation bilaterally. Vascular: No carotid bruits. Neurological Exam: Mental status: alert and oriented to person, place, time and self, speech fluent and not dysarthric, language intact. Cranial nerves: CN I: not tested CN II: pupils equal, round and reactive to light, visual fields intact, fundi unremarkable. CN III, IV, VI:  full range of motion, no nystagmus, left ptosis noted. CN V: reduced sensation of the left V1-V3 distributions, as well as the left side of her neck. CN VII: Very trace weakness of the left side of her upper and lower face. CN VIII: hearing intact CN IX, X: gag intact, uvula midline CN XI: sternocleidomastoid and trapezius muscles intact CN XII: tongue towards the right Bulk & Tone: normal, no fasciculations. Motor: 5/5 throughout Sensation: pinprick and vibration intact Deep Tendon Reflexes: 1+ throughout, toes down-going Finger to nose testing: normal without dysmetria Gait: she does seem to lean towards the left when she walks.  Able to walk in tandem with mild unsteadiness. Romberg negative.  IMPRESSION & PLAN: Rita Hill is a 42 y.o.  female with atypical left sided facial numbness and mild left-sided facial weakness and ataxia/vertigo.    - Symptoms seem to primarily include CN V, VII and VIII.  Ptosis is baseline.  Consider lesion involving the brainstem, posterior fossa (CPA acoustic neuroma vs meningioma) and/or cranial nerves.   - Regarding family history of cerebral aneurysms: At this point, I don't suspect these are symptoms of an aneurysm, but we will see what the MRI shows.  I wouldn't start screening for aneurysms either.  Screening is usually reserved for people with at least 2 first degree relatives with history of cerebral aneurysms or family history of aneurysms with personal history of polycystic kidney disease or connective tissue disease. 1.  Will check MRI of brain w/wo gad. 2.  Will check Lyme, ESR, ANA 3.  Further tests/management pending above results. 4.  In meantime, continue using eye drops and patch at night to prevent corneal abrasions.  If note eye pain, see ophthalmologist or optometrist. 5.  Follow up in one month.  Thank you for allowing me to take part in the care of this  patient.  Shon Millet, DO  CC: Con Memos, MD

## 2012-07-16 LAB — ANTI-NUCLEAR AB-TITER (ANA TITER): ANA Titer 1: 1:40 {titer} — ABNORMAL HIGH

## 2012-07-21 ENCOUNTER — Ambulatory Visit (HOSPITAL_COMMUNITY)
Admission: RE | Admit: 2012-07-21 | Discharge: 2012-07-21 | Disposition: A | Discharge status: Home / Self Care | Payer: 59 | Source: Ambulatory Visit | Attending: Neurology | Admitting: Neurology

## 2012-07-21 DIAGNOSIS — R209 Unspecified disturbances of skin sensation: Secondary | ICD-10-CM | POA: Insufficient documentation

## 2012-07-21 DIAGNOSIS — R269 Unspecified abnormalities of gait and mobility: Secondary | ICD-10-CM | POA: Insufficient documentation

## 2012-07-21 DIAGNOSIS — R200 Anesthesia of skin: Secondary | ICD-10-CM

## 2012-07-21 MED ORDER — GADOBENATE DIMEGLUMINE 529 MG/ML IV SOLN
20.0000 mL | Freq: Once | INTRAVENOUS | Status: AC
  Administered 2012-07-21: 20 mL via INTRAVENOUS

## 2012-07-23 ENCOUNTER — Telehealth: Payer: Self-pay | Admitting: Neurology

## 2012-07-23 ENCOUNTER — Other Ambulatory Visit: Payer: Self-pay | Admitting: Neurology

## 2012-07-23 DIAGNOSIS — R209 Unspecified disturbances of skin sensation: Secondary | ICD-10-CM

## 2012-07-23 NOTE — Telephone Encounter (Signed)
Message copied by Benay Spice on Wed Jul 23, 2012  1:16 PM ------      Message from: JAFFE, ADAM R      Created: Wed Jul 23, 2012  7:29 AM       Please let Rita Hill know that her brain MRI is normal.  Her Lyme test came back normal.  Her ANA and ESR are mildly abnormal.  These are nonspecific markers which may be of no clinical significance, but could suggest a rheumatologic condition.  I would like to test a connective tissue panel to be sure.  Thank you. ------

## 2012-07-23 NOTE — Telephone Encounter (Signed)
Spoke with the patient. Information given to the patient as per Dr. Everlena Cooper below. The patient will come in for additional lab work as recommended. Order will be placed in the computer and the pt will come at her earliest convenience.

## 2012-07-24 ENCOUNTER — Other Ambulatory Visit: Payer: 59

## 2012-07-24 DIAGNOSIS — R209 Unspecified disturbances of skin sensation: Secondary | ICD-10-CM

## 2012-07-25 LAB — EXTRACTABLE NUCLEAR ANTIGEN ANTIBODY
ENA SM Ab Ser-aCnc: 1 AU/mL (ref <30)
SM/RNP: <1 AU/mL (ref <30)
SSA (Ro) (ENA) Antibody, IgG: 2 AU/mL (ref <30)
SSB (La) (ENA) Antibody, IgG: <1 AU/mL (ref <30)
ds DNA Ab: 3 IU/mL (ref <30)

## 2012-07-29 ENCOUNTER — Encounter: Payer: Self-pay | Admitting: Neurology

## 2012-08-15 ENCOUNTER — Ambulatory Visit: Payer: 59 | Admitting: Neurology

## 2012-08-22 ENCOUNTER — Ambulatory Visit (INDEPENDENT_AMBULATORY_CARE_PROVIDER_SITE_OTHER): Payer: 59 | Admitting: Neurology

## 2012-08-22 ENCOUNTER — Encounter: Payer: Self-pay | Admitting: Neurology

## 2012-08-22 VITALS — BP 136/90 | HR 78 | Temp 97.9°F | Ht 67.0 in | Wt 292.0 lb

## 2012-08-22 DIAGNOSIS — R209 Unspecified disturbances of skin sensation: Secondary | ICD-10-CM

## 2012-08-22 DIAGNOSIS — R200 Anesthesia of skin: Secondary | ICD-10-CM

## 2012-08-22 LAB — ANGIOTENSIN CONVERTING ENZYME: Angiotensin-Converting Enzyme: 44 U/L (ref 8–52)

## 2012-08-22 NOTE — Progress Notes (Signed)
NEUROLOGY FOLLOW UP OFFICE NOTE  Rita Hill [id number]  HISTORY OF PRESENT ILLNESS: Rita Hill is a 42 year old woman who presents for follow up regarding left facial numbness/weakness and vertigo.  Records and images were personally reviewed where available.  About 6 weeks ago, she exhibited left facial numbness and inability to close left eye.  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 She didn't think much of it but when she woke up the following morning, the inside of her left cheek and the left side of her lip was also numb. When she woke up the following morning, the whole left side of her face was numb. She also noted that she was unable to close her left eye. 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 Associated symptoms include tinnitus as well as a fullness in the ears. She denies hyperacusis or hearing loss. She also notes a mild throbbing pain on the back of the left side of her head. She denies any neck pain or recent injury of the neck. She is also had balance issues for a few weeks, in which he experiences vertigo and fears towards the left when she walks. She denies any double vision, loss of vision, blurred vision or other visual disturbance. She has not noticed any reduced sweating on the left side of her face.  She also notes reduced taste on the left side of her tongue.  She was prescribed one week regimen of prednisone and Valtrex.  She has not noticed any improvement or worsening of symptoms. Since last visit, eye has improved and is back to baseline.    No change in left facial numbness.  Notes occasional ringing in left ear.  She also notes numbness in the hands, going on for a while but worse this past 3 weeks.  Exacerbated with any activity of the hands, as well as in bed, driving and holding a phone.  She has to shake her hands out.  No neck pain radiating down arms.  No weakness.  Denies unusual rashes or shortness of  breath.  MRI Brain w/wo (07/21/12): Normal.  No braintem lesions, CPA lesion or abnormal enhancement of cranial nerves.  ESR 26, ANA 1:40 and speckled, but ENA negative.  Lyme titer negative.Marland Kitchen  PAST MEDICAL HISTORY: Past Medical History  Diagnosis Date  . Depression   . Headache(784.0)   . Left facial numbness     MEDICATIONS: Current Outpatient Prescriptions on File Prior to Visit  Medication Sig Dispense Refill  . hydrochlorothiazide (HYDRODIURIL) 25 MG tablet Take 1 tablet (25 mg total) by mouth daily.  90 tablet  3  . ibuprofen (ADVIL,MOTRIN) 200 MG tablet Take 400 mg by mouth as needed for pain.      . predniSONE (DELTASONE) 20 MG tablet 2 tablets once daily  14 tablet  0  . valACYclovir (VALTREX) 1000 MG tablet 1 tablet 3 times daily  21 tablet  0   No current facility-administered medications on file prior to visit.    ALLERGIES: No Known Allergies  FAMILY HISTORY: Family History  Problem Relation Age of Onset  . Hypertension Mother   . Hyperlipidemia Mother   . Cerebral aneurysm Maternal Aunt   . Cerebral aneurysm Maternal Grandmother     SOCIAL HISTORY: History   Social History  . Marital Status: Married    Spouse Name: N/A    Number of Children: N/A  . Years of Education:  N/A   Occupational History  . Not on file.   Social History Main Topics  . Smoking status: Never Smoker   . Smokeless tobacco: Never Used  . Alcohol Use: No  . Drug Use: No  . Sexual Activity: Not on file   Other Topics Concern  . Not on file   Social History Narrative  . No narrative on file    PHYSICAL EXAM: Filed Vitals:   08/22/12 0750  BP: 136/90  Pulse: 78  Temp: 97.9 F (36.6 C)   General: No acute distress Head:  Normocephalic/atraumatic Neck: supple, no paraspinal tenderness, full range of motion Neurological Exam: alert and oriented to person, place, and time, speech fluent and not dysarthric, language intact.  Left ptosis (baseline) and left facial  numbness (V1-V3) but otherwise CN II-XII intact, bulk and tone normal, muscle strength 5/5 throughout, sensation to light touch intact, gait normal, Positive Tinel's sign in left, mildly positive Phalen's sign bilaterally.  IMPRESSION & PLAN: 1.  Atypical left facial weakness and numbness.  MRI does not reveal any structural lesions of the brainstem and posterior fossa, or abnormal enhancement of the cranial nerves.  Lab work did reveal mildly elevated ESR and mildly positive ANA, but work up looking for typical connective tissue disease was unremarkable, so this may be a nonspecific finding that is not clinically relevant.  At this point, it seems that she may have had a viral inflammation (such as may be seen in Bell's palsy, although atypical presentation).  To be complete, we can check an ACE, although she doesn't exhibit any other symptoms to suggest sarcoid.   2.  Bilateral hand numbness.  Likely carpal tunnel syndrome.  Recommend bilateral wrist splints.  May call if she would want to pursue EMG.  Follow up as needed  Shon Millet, DO  CC:  Rita Chiquito, MD

## 2012-08-22 NOTE — Patient Instructions (Addendum)
Most likely viral inflammation of the nerve.  To be complete, we will check another blood test.  Hand numbness likely carpal tunnel.  Try wearing wrist splints at bed or whenever possible.  If no improvement, call and we can schedule nerve conduction testing.

## 2012-08-25 ENCOUNTER — Encounter: Payer: Self-pay | Admitting: Neurology

## 2012-09-30 ENCOUNTER — Other Ambulatory Visit: Payer: Self-pay

## 2012-09-30 DIAGNOSIS — Z1231 Encounter for screening mammogram for malignant neoplasm of breast: Secondary | ICD-10-CM

## 2012-10-02 ENCOUNTER — Ambulatory Visit
Admission: RE | Admit: 2012-10-02 | Discharge: 2012-10-02 | Disposition: A | Discharge status: Home / Self Care | Payer: 59 | Source: Ambulatory Visit

## 2012-10-02 DIAGNOSIS — Z1231 Encounter for screening mammogram for malignant neoplasm of breast: Secondary | ICD-10-CM

## 2012-11-06 ENCOUNTER — Other Ambulatory Visit: Payer: Self-pay

## 2013-10-16 ENCOUNTER — Other Ambulatory Visit: Payer: Self-pay

## 2013-11-20 ENCOUNTER — Other Ambulatory Visit: Payer: Self-pay

## 2013-11-20 DIAGNOSIS — Z1231 Encounter for screening mammogram for malignant neoplasm of breast: Secondary | ICD-10-CM

## 2013-12-07 ENCOUNTER — Ambulatory Visit
Admission: RE | Admit: 2013-12-07 | Discharge: 2013-12-07 | Disposition: A | Discharge status: Home / Self Care | Payer: 59 | Source: Ambulatory Visit

## 2013-12-07 DIAGNOSIS — Z1231 Encounter for screening mammogram for malignant neoplasm of breast: Secondary | ICD-10-CM

## 2015-04-11 LAB — CYTOLOGY - PAP: Pap: NEGATIVE

## 2015-08-27 ENCOUNTER — Encounter: Payer: Self-pay | Admitting: *Deleted

## 2016-09-20 ENCOUNTER — Encounter: Payer: Self-pay | Admitting: Internal Medicine

## 2018-12-30 ENCOUNTER — Other Ambulatory Visit: Payer: Self-pay | Admitting: Obstetrics & Gynecology

## 2019-02-06 ENCOUNTER — Other Ambulatory Visit: Payer: Self-pay | Admitting: Obstetrics & Gynecology

## 2019-03-30 ENCOUNTER — Ambulatory Visit: Payer: Self-pay | Attending: Internal Medicine

## 2019-03-30 DIAGNOSIS — Z23 Encounter for immunization: Secondary | ICD-10-CM

## 2019-03-30 NOTE — Progress Notes (Signed)
   Covid-19 Vaccination Clinic  Name:  TRAMEKA DOROUGH    MRN: [id number] DOB: 11-23-1970  03/30/2019  Ms. Rylee was observed post Covid-19 immunization for 15 minutes without incident. She was provided with Vaccine Information Sheet and instruction to access the V-Safe system.   Ms. Lastra was instructed to call 911 with any severe reactions post vaccine: Marland Kitchen Difficulty breathing  . Swelling of face and throat  . A fast heartbeat  . A bad rash all over body  . Dizziness and weakness   Immunizations Administered    Name Date Dose VIS Date Route   Pfizer COVID-19 Vaccine 03/30/2019 12:49 PM 0.3 mL 12/12/2018 Intramuscular   Manufacturer: [employer], [employer]   Lot: [id number]   NDC: [address]-3

## 2019-04-22 ENCOUNTER — Ambulatory Visit: Payer: Self-pay | Attending: Internal Medicine

## 2019-04-22 DIAGNOSIS — Z23 Encounter for immunization: Secondary | ICD-10-CM

## 2019-04-22 NOTE — Progress Notes (Signed)
   Covid-19 Vaccination Clinic  Name:  THEDORA RINGS    MRN: [id number] DOB: May 13, 1970  04/22/2019  Ms. Joswick was observed post Covid-19 immunization for 15 minutes without incident. She was provided with Vaccine Information Sheet and instruction to access the V-Safe system.   Ms. Kerwin was instructed to call 911 with any severe reactions post vaccine: Marland Kitchen Difficulty breathing  . Swelling of face and throat  . A fast heartbeat  . A bad rash all over body  . Dizziness and weakness   Immunizations Administered    Name Date Dose VIS Date Route   Pfizer COVID-19 Vaccine 04/22/2019  3:52 PM 0.3 mL 02/25/2018 Intramuscular   Manufacturer: [employer], [employer]   Lot: [id number]   NDC: [address]-7

## 2021-08-11 ENCOUNTER — Ambulatory Visit (HOSPITAL_BASED_OUTPATIENT_CLINIC_OR_DEPARTMENT_OTHER)
Admission: RE | Admit: 2021-08-11 | Discharge: 2021-08-11 | Disposition: A | Discharge status: Home / Self Care | Payer: No Typology Code available for payment source | Source: Ambulatory Visit

## 2021-08-11 ENCOUNTER — Encounter (HOSPITAL_BASED_OUTPATIENT_CLINIC_OR_DEPARTMENT_OTHER): Payer: Self-pay | Admitting: Radiology

## 2021-08-11 ENCOUNTER — Other Ambulatory Visit (HOSPITAL_BASED_OUTPATIENT_CLINIC_OR_DEPARTMENT_OTHER): Payer: Self-pay | Admitting: Emergency Medicine

## 2021-08-11 ENCOUNTER — Encounter (HOSPITAL_BASED_OUTPATIENT_CLINIC_OR_DEPARTMENT_OTHER): Payer: Self-pay

## 2021-08-11 ENCOUNTER — Other Ambulatory Visit (HOSPITAL_BASED_OUTPATIENT_CLINIC_OR_DEPARTMENT_OTHER): Payer: Self-pay

## 2021-08-11 DIAGNOSIS — Z1231 Encounter for screening mammogram for malignant neoplasm of breast: Secondary | ICD-10-CM

## 2022-02-20 LAB — EXTERNAL GENERIC LAB PROCEDURE: COLOGUARD: NEGATIVE

## 2022-02-20 LAB — COLOGUARD: COLOGUARD: NEGATIVE

## 2022-09-20 ENCOUNTER — Other Ambulatory Visit: Payer: Self-pay | Admitting: Obstetrics and Gynecology

## 2022-09-20 DIAGNOSIS — R928 Other abnormal and inconclusive findings on diagnostic imaging of breast: Secondary | ICD-10-CM

## 2022-10-01 ENCOUNTER — Institutional Professional Consult (permissible substitution): Payer: No Typology Code available for payment source | Admitting: Plastic Surgery

## 2023-09-16 ENCOUNTER — Encounter: Payer: Self-pay | Admitting: Obstetrics and Gynecology

## 2023-12-30 ENCOUNTER — Other Ambulatory Visit: Payer: Self-pay | Admitting: Medical Genetics

## 2024-01-28 ENCOUNTER — Other Ambulatory Visit: Payer: Self-pay | Admitting: Medical Genetics

## 2024-01-28 DIAGNOSIS — Z006 Encounter for examination for normal comparison and control in clinical research program: Secondary | ICD-10-CM

## 2024-02-25 ENCOUNTER — Other Ambulatory Visit (HOSPITAL_COMMUNITY)
